# Patient Record
Sex: Male | Born: 2000
Health system: Southern US, Community
[De-identification: ages and names within clinical notes are randomized; demographics above are authoritative.]

---
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
## 2022-02-10 ENCOUNTER — Telehealth: Payer: Self-pay | Admitting: Internal Medicine

## 2022-02-10 NOTE — Telephone Encounter (Signed)
Pt's mother called and requested to make a new patient appointment for Mr. Darren Mayer.   No major issues, just aged out of pediatrician, and needs adhd medication refilled.

## 2022-02-10 NOTE — Telephone Encounter (Signed)
I can accept him-scheduled for 11 AM new patient visit.

## 2022-02-12 NOTE — Telephone Encounter (Signed)
Appointment made

## 2022-03-11 DIAGNOSIS — J309 Allergic rhinitis, unspecified: Secondary | ICD-10-CM | POA: Insufficient documentation

## 2022-03-11 DIAGNOSIS — R278 Other lack of coordination: Secondary | ICD-10-CM | POA: Insufficient documentation

## 2022-03-11 NOTE — Patient Instructions (Addendum)
        Medications changes include :   none    Return in about 6 months (around 09/10/2022) for Physical Exam.

## 2022-03-11 NOTE — Progress Notes (Signed)
Subjective:    Patient ID: Darren Mayer, male    DOB: [birth year], 22 y.o.   MRN: [id number]     HPI Darren Mayer  is here to establish with a new pcp.      He is currently in college-goes to near [employer] in [place].  He is studying finance/accounting.  ADD-since first or second grade he has been on Vyvanse and was initially on 10 mg daily.  In high school that was increased to 20 mg daily.  He takes it only on school days.  It does decrease his appetite a little bit.  He does have a delayed growth spurt, but has increased in height gain over the past year.  He is here with his mother.  They have no concerns.     Medications and allergies reviewed with patient and updated if appropriate.  Current Outpatient Medications on File Prior to Visit  Medication Sig Dispense Refill   Multiple Vitamin (MULTI VITAMIN PO) Take by mouth.     No current facility-administered medications on file prior to visit.    Review of Systems  Constitutional:  Negative for fatigue and fever.  Respiratory:  Negative for cough, shortness of breath and wheezing.   Cardiovascular:  Negative for chest pain, palpitations and leg swelling.  Gastrointestinal:  Negative for abdominal pain, constipation and diarrhea.  Musculoskeletal:  Negative for arthralgias and back pain.  Neurological:  Negative for dizziness, light-headedness and headaches.  Psychiatric/Behavioral:  Negative for dysphoric mood and sleep disturbance. The patient is not nervous/anxious.        Objective:   Vitals:   03/12/22 1110  BP: 110/76  Pulse: 78  Temp: 98.6 F (37 C)  SpO2: 98%   Filed Weights   03/12/22 1110  Weight: 113 lb 3.2 oz (51.3 kg)   Body mass index is 17.73 kg/m.  BP Readings from Last 3 Encounters:  03/12/22 110/76  09/22/20 106/70 (19 %, Z = -0.88 /  65 %, Z = 0.39)*  04/14/20 (!) 118/62 (62 %, Z = 0.31 /  37 %, Z = -0.33)*   *BP percentiles are based on the 2017 AAP  Clinical Practice Guideline for boys    Wt Readings from Last 3 Encounters:  03/12/22 113 lb 3.2 oz (51.3 kg) (1 %, Z= -2.20)*  09/22/20 106 lb 3.2 oz (48.2 kg) (<1 %, Z= -2.35)*  04/14/20 101 lb 6.4 oz (46 kg) (<1 %, Z= -2.55)*   * Growth percentiles are based on CDC (Boys, 2-20 Years) data.      Physical Exam Constitutional:      General: He is not in acute distress.    Appearance: Normal appearance. He is not ill-appearing.  HENT:     Head: Normocephalic and atraumatic.  Eyes:     Conjunctiva/sclera: Conjunctivae normal.  Cardiovascular:     Rate and Rhythm: Normal rate and regular rhythm.     Heart sounds: Normal heart sounds. No murmur heard. Pulmonary:     Effort: Pulmonary effort is normal. No respiratory distress.     Breath sounds: Normal breath sounds. No wheezing or rales.  Musculoskeletal:     Right lower leg: No edema.     Left lower leg: No edema.  Skin:    General: Skin is warm and dry.     Findings: No rash.  Neurological:     Mental Status: He is alert. Mental status is at baseline.  Psychiatric:        Mood  and Affect: Mood normal.            Assessment & Plan:    See Problem List for Assessment and Plan of chronic medical problems.

## 2022-03-12 ENCOUNTER — Encounter: Payer: Self-pay | Admitting: Internal Medicine

## 2022-03-12 ENCOUNTER — Ambulatory Visit: Payer: 59 | Admitting: Internal Medicine

## 2022-03-12 VITALS — BP 110/76 | HR 78 | Temp 98.6°F | Ht 67.0 in | Wt 113.2 lb

## 2022-03-12 DIAGNOSIS — R278 Other lack of coordination: Secondary | ICD-10-CM

## 2022-03-12 DIAGNOSIS — F902 Attention-deficit hyperactivity disorder, combined type: Secondary | ICD-10-CM

## 2022-03-12 DIAGNOSIS — J309 Allergic rhinitis, unspecified: Secondary | ICD-10-CM

## 2022-03-12 MED ORDER — VYVANSE 20 MG PO CAPS: 20.0000 mg | ORAL_CAPSULE | Freq: Every morning | ORAL | 0 refills | Status: DC

## 2022-03-12 NOTE — Assessment & Plan Note (Signed)
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

## 2022-03-12 NOTE — Assessment & Plan Note (Signed)
Chronic Has been on Vyvanse since first grade-has been 10 mg up until high school and since then 20 mg daily This does decrease his appetite slightly and he is trying to gain weight, but otherwise it is well-tolerated He takes this only on school days Continue Vyvanse 20 mg daily

## 2022-03-23 ENCOUNTER — Encounter: Payer: Self-pay | Admitting: Internal Medicine

## 2022-09-10 ENCOUNTER — Encounter (INDEPENDENT_AMBULATORY_CARE_PROVIDER_SITE_OTHER): Payer: Self-pay

## 2022-09-16 ENCOUNTER — Telehealth: Payer: Self-pay | Admitting: Internal Medicine

## 2022-09-16 MED ORDER — VYVANSE 20 MG PO CAPS: 20.0000 mg | ORAL_CAPSULE | Freq: Every morning | ORAL | 0 refills | Status: DC

## 2022-09-16 NOTE — Telephone Encounter (Signed)
Prescription Request  09/16/2022  LOV: 03/12/2022  What is the name of the medication or equipment? VYVANSE 20 MG capsule   Have you contacted your pharmacy to request a refill? No   Which pharmacy would you like this sent to?  [facility] in [address] [place], [place]    Patient notified that their request is being sent to the clinical staff for review and that they should receive a response within 2 business days.   Please advise at Mobile [phone] (mobile)

## 2022-09-16 NOTE — Telephone Encounter (Signed)
Done erx 

## 2022-09-16 NOTE — Telephone Encounter (Signed)
Pt states she need the Vyvanse sent to the [facility] in [place]. Updated pharmacy.Marland KitchenRaechel Chute

## 2022-09-16 NOTE — Telephone Encounter (Signed)
MD is out of the office forwarding to DOD for approval../lmb

## 2022-09-30 ENCOUNTER — Telehealth: Payer: Self-pay | Admitting: Internal Medicine

## 2022-09-30 MED ORDER — LISDEXAMFETAMINE DIMESYLATE 20 MG PO CAPS: 20.0000 mg | ORAL_CAPSULE | Freq: Every day | ORAL | 0 refills | Status: DC

## 2022-09-30 NOTE — Telephone Encounter (Signed)
Patient called and want to know if the generic for vyvanse in to [facility] on [address].  He was unable to pick up the other because the insurance would not pay for it.

## 2022-09-30 NOTE — Telephone Encounter (Signed)
Pt called requesting refill of generic vyvanse. Was last called in on 09/16/22, however pt did not fill due to wrong pharmacy and was not generic. PDMP reviewed - pts last fill was in January 2024. I did call in a 30 day supply, however pt will need to address with PCP as he is taking extremely sporadically and last fill was 7 months ago.

## 2022-10-06 NOTE — Telephone Encounter (Signed)
LM for patient to return call to clinic.

## 2022-12-30 ENCOUNTER — Telehealth: Payer: Self-pay | Admitting: Internal Medicine

## 2022-12-30 NOTE — Telephone Encounter (Signed)
Patient mother dropped off document [employer], to be filled out by provider. Patient mother requested to send it back via Call Patient mother to pick up within 7-days. Document is located in providers tray at front office.Please advise at Mobile [phone] (mobile)

## 2023-01-03 NOTE — Telephone Encounter (Signed)
Form received out of box on 12/31/22.

## 2023-01-06 DIAGNOSIS — Z0279 Encounter for issue of other medical certificate: Secondary | ICD-10-CM

## 2023-01-06 NOTE — Telephone Encounter (Signed)
Form completed and awaiting signature from Dr. Lawerance Bach.

## 2023-01-07 NOTE — Telephone Encounter (Signed)
Left message for patient's mom that paperwork complete and left up front for pick up.

## 2023-01-10 NOTE — Telephone Encounter (Signed)
Patient's mother has picked up paperwork.

## 2023-02-07 ENCOUNTER — Telehealth: Payer: Self-pay | Admitting: Internal Medicine

## 2023-02-07 MED ORDER — LISDEXAMFETAMINE DIMESYLATE 20 MG PO CAPS: 20.0000 mg | ORAL_CAPSULE | Freq: Every day | ORAL | 0 refills | Status: DC

## 2023-02-07 NOTE — Telephone Encounter (Signed)
Prescription Request  02/07/2023  LOV: 03/12/2022  What is the name of the medication or equipment? vyvanse  Have you contacted your pharmacy to request a refill? Yes   Which pharmacy would you like this sent to?  [facility]  - [address], [place], [place]  [address]   Patient notified that their request is being sent to the clinical staff for review and that they should receive a response within 2 business days.   Please advise at Mobile [phone] (mobile)

## 2023-02-07 NOTE — Telephone Encounter (Signed)
[facility]-prescription sent

## 2023-03-08 ENCOUNTER — Encounter: Payer: Self-pay | Admitting: Internal Medicine

## 2023-03-08 NOTE — Progress Notes (Signed)
    Subjective:    Patient ID: Darren Mayer, male    DOB: December 22, 2000, 22 y.o.   MRN: [id number]     HPI Darren Mayer is here for a physical exam and his chronic medical problems.      Medications and allergies reviewed with patient and updated if appropriate.  Current Outpatient Medications on File Prior to Visit  Medication Sig Dispense Refill  . Multiple Vitamin (MULTI VITAMIN PO) Take by mouth.     No current facility-administered medications on file prior to visit.    Review of Systems     Objective:  There were no vitals filed for this visit. There were no vitals filed for this visit. There is no height or weight on file to calculate BMI.  BP Readings from Last 3 Encounters:  04/04/23 112/72  03/12/22 110/76  09/22/20 106/70 (19%, Z = -0.88 /  65%, Z = 0.39)*   *BP percentiles are based on the 2017 AAP Clinical Practice Guideline for boys    Wt Readings from Last 3 Encounters:  04/04/23 113 lb (51.3 kg)  03/12/22 113 lb 3.2 oz (51.3 kg) (1%, Z= -2.20)*  09/22/20 106 lb 3.2 oz (48.2 kg) (<1%, Z= -2.35)*   * Growth percentiles are based on CDC (Boys, 2-20 Years) data.      Physical Exam Constitutional: He appears well-developed and well-nourished. No distress.  HENT:  Head: Normocephalic and atraumatic.  Right Ear: External ear normal.  Left Ear: External ear normal.  Normal ear canals and TM b/l  Mouth/Throat: Oropharynx is clear and moist. Eyes: Conjunctivae and EOM are normal.  Neck: Neck supple. No tracheal deviation present. No thyromegaly present.  No carotid bruit  Cardiovascular: Normal rate, regular rhythm, normal heart sounds and intact distal pulses.   No murmur heard.  No lower extremity edema. Pulmonary/Chest: Effort normal and breath sounds normal. No respiratory distress. He has no wheezes. He has no rales.  Abdominal: Soft. He exhibits no distension. There is no tenderness.  Genitourinary: deferred  Lymphadenopathy:    He has no cervical adenopathy.  Skin: Skin is warm and dry. He is not diaphoretic.  Psychiatric: He has a normal mood and affect. His behavior is normal.         Assessment & Plan:   Physical exam: Screening blood work  ordered Exercise    Weight   Substance abuse   none   Reviewed recommended immunizations.   Health Maintenance  Topic Date Due  . HPV VACCINES (1 - Male 3-dose series) Never done  . HIV Screening  Never done  . Hepatitis C Screening  Never done  . COVID-19 Vaccine (1 - 2024-25 season) 04/20/2023 (Originally 11/07/2022)  . INFLUENZA VACCINE  06/06/2023 (Originally 10/07/2022)  . DTaP/Tdap/Td (1 - Tdap) 04/03/2024 (Originally 01/17/2022)     See Problem List for Assessment and Plan of chronic medical problems.   This encounter was created in error - please disregard.

## 2023-03-08 NOTE — Patient Instructions (Addendum)
 Blood work was ordered.       Medications changes include :   None    A referral was ordered and someone will call you to schedule an appointment.     Return in about 1 year (around 03/13/2024) for Physical Exam.   Health Maintenance, Male Adopting a healthy lifestyle and getting preventive care are important in promoting health and wellness. Ask your health care provider about: The right schedule for you to have regular tests and exams. Things you can do on your own to prevent diseases and keep yourself healthy. What should I know about diet, weight, and exercise? Eat a healthy diet  Eat a diet that includes plenty of vegetables, fruits, low-fat dairy products, and lean protein. Do not eat a lot of foods that are high in solid fats, added sugars, or sodium. Maintain a healthy weight Body mass index (BMI) is a measurement that can be used to identify possible weight problems. It estimates body fat based on height and weight. Your health care provider can help determine your BMI and help you achieve or maintain a healthy weight. Get regular exercise Get regular exercise. This is one of the most important things you can do for your health. Most adults should: Exercise for at least 150 minutes each week. The exercise should increase your heart rate and make you sweat (moderate-intensity exercise). Do strengthening exercises at least twice a week. This is in addition to the moderate-intensity exercise. Spend less time sitting. Even light physical activity can be beneficial. Watch cholesterol and blood lipids Have your blood tested for lipids and cholesterol at 20 years of age, then have this test every 5 years. You may need to have your cholesterol levels checked more often if: Your lipid or cholesterol levels are high. You are older than 20 years of age. You are at high risk for heart disease. What should I know about cancer screening? Many types of cancers can be detected  early and may often be prevented. Depending on your health history and family history, you may need to have cancer screening at various ages. This may include screening for: Colorectal cancer. Prostate cancer. Skin cancer. Lung cancer. What should I know about heart disease, diabetes, and high blood pressure? Blood pressure and heart disease High blood pressure causes heart disease and increases the risk of stroke. This is more likely to develop in people who have high blood pressure readings or are overweight. Talk with your health care provider about your target blood pressure readings. Have your blood pressure checked: Every 3-5 years if you are 85-100 years of age. Every year if you are 6 years old or older. If you are between the ages of 18 and 97 and are a current or former smoker, ask your health care provider if you should have a one-time screening for abdominal aortic aneurysm (AAA). Diabetes Have regular diabetes screenings. This checks your fasting blood sugar level. Have the screening done: Once every three years after age 54 if you are at a normal weight and have a low risk for diabetes. More often and at a younger age if you are overweight or have a high risk for diabetes. What should I know about preventing infection? Hepatitis B If you have a higher risk for hepatitis B, you should be screened for this virus. Talk with your health care provider to find out if you are at risk for hepatitis B infection. Hepatitis C Blood testing is recommended for:  Everyone born from [birth year] through 1965. Anyone with known risk factors for hepatitis C. Sexually transmitted infections (STIs) You should be screened each year for STIs, including gonorrhea and chlamydia, if: You are sexually active and are younger than 20 years of age. You are older than 20 years of age and your health care provider tells you that you are at risk for this type of infection. Your sexual activity has changed since  you were last screened, and you are at increased risk for chlamydia or gonorrhea. Ask your health care provider if you are at risk. Ask your health care provider about whether you are at high risk for HIV. Your health care provider may recommend a prescription medicine to help prevent HIV infection. If you choose to take medicine to prevent HIV, you should first get tested for HIV. You should then be tested every 3 months for as long as you are taking the medicine. Follow these instructions at home: Alcohol use Do not drink alcohol if your health care provider tells you not to drink. If you drink alcohol: Limit how much you have to 0-2 drinks a day. Know how much alcohol is in your drink. In the [place] one drink equals one 12 oz bottle of beer (355 mL), one 5 oz glass of wine (148 mL), or one 1 oz glass of hard liquor (44 mL). Lifestyle Do not use any products that contain nicotine or tobacco. These products include cigarettes, chewing tobacco, and vaping devices, such as e-cigarettes. If you need help quitting, ask your health care provider. Do not use street drugs. Do not share needles. Ask your health care provider for help if you need support or information about quitting drugs. General instructions Schedule regular health, dental, and eye exams. Stay current with your vaccines. Tell your health care provider if: You often feel depressed. You have ever been abused or do not feel safe at home. Summary Adopting a healthy lifestyle and getting preventive care are important in promoting health and wellness. Follow your health care provider's instructions about healthy diet, exercising, and getting tested or screened for diseases. Follow your health care provider's instructions on monitoring your cholesterol and blood pressure. This information is not intended to replace advice given to you by your health care provider. Make sure you discuss any questions you have with your health care  provider. Document Revised: 07/14/2020 Document Reviewed: 07/14/2020 Elsevier Patient Education  2024 [employer].

## 2023-03-14 ENCOUNTER — Encounter: Payer: 59 | Admitting: Internal Medicine

## 2023-03-14 DIAGNOSIS — Z0000 Encounter for general adult medical examination without abnormal findings: Secondary | ICD-10-CM

## 2023-04-03 ENCOUNTER — Encounter: Payer: Self-pay | Admitting: Internal Medicine

## 2023-04-03 NOTE — Progress Notes (Unsigned)
Subjective:    Patient ID: Darren Mayer, male    DOB: 10-Nov-2000, 22 y.o.   MRN: [id number]     HPI Darren Mayer is here for a physical exam and his chronic medical problems.   Darren Mayer well - no concerns.  He just returned from [place] where he went for a study abroad program for college  He has no concerns   Medications and allergies reviewed with patient and updated if appropriate.  Current Outpatient Medications on File Prior to Visit  Medication Sig Dispense Refill   Multiple Vitamin (MULTI VITAMIN PO) Take by mouth.     No current facility-administered medications on file prior to visit.    Review of Systems  Constitutional:  Negative for fever.  Eyes:  Negative for visual disturbance.  Respiratory:  Negative for cough, shortness of breath and wheezing.   Cardiovascular:  Negative for chest pain, palpitations and leg swelling.  Gastrointestinal:  Negative for abdominal pain, blood in stool, constipation and diarrhea.       No gerd  Genitourinary:  Negative for difficulty urinating and dysuria.  Musculoskeletal:  Negative for arthralgias and back pain.  Skin:  Negative for rash.  Neurological:  Negative for light-headedness and headaches.  Psychiatric/Behavioral:  Negative for dysphoric mood. The patient is not nervous/anxious.        Objective:   Vitals:   04/04/23 1453  BP: 112/72  Pulse: 86  Temp: 98.6 F (37 C)  SpO2: 98%   Filed Weights   04/04/23 1453  Weight: 113 lb (51.3 kg)   Body mass index is 17.7 kg/m.  BP Readings from Last 3 Encounters:  04/04/23 112/72  03/12/22 110/76  09/22/20 106/70 (19%, Z = -0.88 /  65%, Z = 0.39)*   *BP percentiles are based on the 2017 AAP Clinical Practice Guideline for boys    Wt Readings from Last 3 Encounters:  04/04/23 113 lb (51.3 kg)  03/12/22 113 lb 3.2 oz (51.3 kg) (1%, Z= -2.20)*  09/22/20 106 lb 3.2 oz (48.2 kg) (<1%, Z= -2.35)*   * Growth percentiles are based on CDC (Boys,  2-20 Years) data.      Physical Exam Constitutional: He appears well-developed and well-nourished. No distress.  HENT:  Head: Normocephalic and atraumatic.  Right Ear: External ear normal.  Left Ear: External ear normal.  Normal ear canals and TM b/l  Mouth/Throat: Oropharynx is clear and moist. Eyes: Conjunctivae and EOM are normal.  Neck: Neck supple. No tracheal deviation present. No thyromegaly present.  No carotid bruit  Cardiovascular: Normal rate, regular rhythm, normal heart sounds and intact distal pulses.   No murmur heard.  No lower extremity edema. Pulmonary/Chest: Effort normal and breath sounds normal. No respiratory distress. He has no wheezes. He has no rales.  Abdominal: Soft. He exhibits no distension. There is no tenderness.  Genitourinary: deferred  Lymphadenopathy:   He has no cervical adenopathy.  Skin: Skin is warm and dry. He is not diaphoretic.  Psychiatric: He has a normal mood and affect. His behavior is normal.         Assessment & Plan:   Physical exam: Screening blood work deferred Exercise   club soccer, gym occ Weight  normal  - on light side - stable Substance abuse   none He does wear his seatbelt daily He started does drink some alcohol advised to drink responsibly Advised protection when and if sexually active  Reviewed recommended immunizations.  We do have his pediatricians records  for his immunizations and it looks like he is up-to-date, but will need to abstract and can reevaluate at his next visit   Health Maintenance  Topic Date Due   HPV VACCINES (1 - Male 3-dose series) Never done   HIV Screening  Never done   Hepatitis C Screening  Never done   COVID-19 Vaccine (1 - 2024-25 season) 04/20/2023 (Originally 11/07/2022)   INFLUENZA VACCINE  06/06/2023 (Originally 10/07/2022)   DTaP/Tdap/Td (1 - Tdap) 04/03/2024 (Originally 01/17/2022)     See Problem List for Assessment and Plan of chronic medical problems.

## 2023-04-03 NOTE — Patient Instructions (Addendum)
Blood work was ordered.       Medications changes include :   None    A referral was ordered and someone will call you to schedule an appointment.     Return in about 6 months (around 10/02/2023) for follow up.   Health Maintenance, Male Adopting a healthy lifestyle and getting preventive care are important in promoting health and wellness. Ask your health care provider about: The right schedule for you to have regular tests and exams. Things you can do on your own to prevent diseases and keep yourself healthy. What should I know about diet, weight, and exercise? Eat a healthy diet  Eat a diet that includes plenty of vegetables, fruits, low-fat dairy products, and lean protein. Do not eat a lot of foods that are high in solid fats, added sugars, or sodium. Maintain a healthy weight Body mass index (BMI) is a measurement that can be used to identify possible weight problems. It estimates body fat based on height and weight. Your health care provider can help determine your BMI and help you achieve or maintain a healthy weight. Get regular exercise Get regular exercise. This is one of the most important things you can do for your health. Most adults should: Exercise for at least 150 minutes each week. The exercise should increase your heart rate and make you sweat (moderate-intensity exercise). Do strengthening exercises at least twice a week. This is in addition to the moderate-intensity exercise. Spend less time sitting. Even light physical activity can be beneficial. Watch cholesterol and blood lipids Have your blood tested for lipids and cholesterol at 21 years of age, then have this test every 5 years. You may need to have your cholesterol levels checked more often if: Your lipid or cholesterol levels are high. You are older than 21 years of age. You are at high risk for heart disease. What should I know about cancer screening? Many types of cancers can be detected  early and may often be prevented. Depending on your health history and family history, you may need to have cancer screening at various ages. This may include screening for: Colorectal cancer. Prostate cancer. Skin cancer. Lung cancer. What should I know about heart disease, diabetes, and high blood pressure? Blood pressure and heart disease High blood pressure causes heart disease and increases the risk of stroke. This is more likely to develop in people who have high blood pressure readings or are overweight. Talk with your health care provider about your target blood pressure readings. Have your blood pressure checked: Every 3-5 years if you are 53-42 years of age. Every year if you are 17 years old or older. If you are between the ages of 64 and 61 and are a current or former smoker, ask your health care provider if you should have a one-time screening for abdominal aortic aneurysm (AAA). Diabetes Have regular diabetes screenings. This checks your fasting blood sugar level. Have the screening done: Once every three years after age 35 if you are at a normal weight and have a low risk for diabetes. More often and at a younger age if you are overweight or have a high risk for diabetes. What should I know about preventing infection? Hepatitis B If you have a higher risk for hepatitis B, you should be screened for this virus. Talk with your health care provider to find out if you are at risk for hepatitis B infection. Hepatitis C Blood testing is recommended for:  Everyone born from [birth year] through 1965. Anyone with known risk factors for hepatitis C. Sexually transmitted infections (STIs) You should be screened each year for STIs, including gonorrhea and chlamydia, if: You are sexually active and are younger than 21 years of age. You are older than 21 years of age and your health care provider tells you that you are at risk for this type of infection. Your sexual activity has changed since  you were last screened, and you are at increased risk for chlamydia or gonorrhea. Ask your health care provider if you are at risk. Ask your health care provider about whether you are at high risk for HIV. Your health care provider may recommend a prescription medicine to help prevent HIV infection. If you choose to take medicine to prevent HIV, you should first get tested for HIV. You should then be tested every 3 months for as long as you are taking the medicine. Follow these instructions at home: Alcohol use Do not drink alcohol if your health care provider tells you not to drink. If you drink alcohol: Limit how much you have to 0-2 drinks a day. Know how much alcohol is in your drink. In the [place] one drink equals one 12 oz bottle of beer (355 mL), one 5 oz glass of wine (148 mL), or one 1 oz glass of hard liquor (44 mL). Lifestyle Do not use any products that contain nicotine or tobacco. These products include cigarettes, chewing tobacco, and vaping devices, such as e-cigarettes. If you need help quitting, ask your health care provider. Do not use street drugs. Do not share needles. Ask your health care provider for help if you need support or information about quitting drugs. General instructions Schedule regular health, dental, and eye exams. Stay current with your vaccines. Tell your health care provider if: You often feel depressed. You have ever been abused or do not feel safe at home. Summary Adopting a healthy lifestyle and getting preventive care are important in promoting health and wellness. Follow your health care provider's instructions about healthy diet, exercising, and getting tested or screened for diseases. Follow your health care provider's instructions on monitoring your cholesterol and blood pressure. This information is not intended to replace advice given to you by your health care provider. Make sure you discuss any questions you have with your health care  provider. Document Revised: 07/14/2020 Document Reviewed: 07/14/2020 Elsevier Patient Education  2024 [employer].

## 2023-04-04 ENCOUNTER — Ambulatory Visit (INDEPENDENT_AMBULATORY_CARE_PROVIDER_SITE_OTHER): Payer: 59 | Admitting: Internal Medicine

## 2023-04-04 VITALS — BP 112/72 | HR 86 | Temp 98.6°F | Ht 67.0 in | Wt 113.0 lb

## 2023-04-04 DIAGNOSIS — Z0000 Encounter for general adult medical examination without abnormal findings: Secondary | ICD-10-CM

## 2023-04-04 DIAGNOSIS — F902 Attention-deficit hyperactivity disorder, combined type: Secondary | ICD-10-CM

## 2023-04-04 MED ORDER — LISDEXAMFETAMINE DIMESYLATE 20 MG PO CAPS: 20.0000 mg | ORAL_CAPSULE | Freq: Every day | ORAL | 0 refills | Status: DC

## 2023-04-04 NOTE — Assessment & Plan Note (Signed)
Chronic Controlled Tolerates medication well without side effects Only takes on school days Continue Vyvanse 20 mg daily

## 2023-04-15 ENCOUNTER — Other Ambulatory Visit: Payer: Self-pay | Admitting: Internal Medicine

## 2023-04-15 NOTE — Telephone Encounter (Signed)
   Copied from CRM [id number]. Topic: Clinical - Medication Refill >> Apr 15, 2023  3:09 PM Tonda B wrote: Most Recent Primary Care Visit:  Provider: GEOFM GLADE PARAS  Department: [facility]  Visit Type: PHYSICAL  Date: 04/04/2023  Medication: lisdexamfetamine (VYVANSE ) 20 MG  Has the patient contacted their pharmacy? Yes (Agent: If no, request that the patient contact the pharmacy for the refill. If patient does not wish to contact the pharmacy document the reason why and proceed with request.) (Agent: If yes, when and what did the pharmacy advise?)  Is this the correct pharmacy for this prescription? Yes If no, delete pharmacy and type the correct one.  This is the patient's preferred pharmacy:    [facility] #[id number], [place] - [address] AT [facility] [address] [place] [address] Phone: [phone] Fax: [phone]   Has the prescription been filled recently? Yes  Is the patient out of the medication? Yes  Has the patient been seen for an appointment in the last year OR does the patient have an upcoming appointment? Yes  Can we respond through MyChart? No  Agent: Please be advised that Rx refills may take up to 3 business days. We ask that you follow-up with your pharmacy.

## 2023-04-16 MED ORDER — LISDEXAMFETAMINE DIMESYLATE 20 MG PO CAPS: 20.0000 mg | ORAL_CAPSULE | Freq: Every day | ORAL | 0 refills | Status: DC

## 2023-04-22 ENCOUNTER — Telehealth: Payer: Self-pay

## 2023-04-22 NOTE — Telephone Encounter (Signed)
Copied from CRM [id number]. Topic: Clinical - Prescription Issue >> Apr 22, 2023  2:48 PM Isabell A wrote: Reason for CRM: Mom calling in regard to lisdexamfetamine (VYVANSE) 20 MG, refills were sent to the wrong pharmacy - she is requesting the generic brand. Insurance wont cover the non-generic brand.    [facility] #[id number] - [place], [place] - [address] AT [facility] [address] [place] [address]  Phone: [phone] Fax: [phone]

## 2023-04-25 MED ORDER — LISDEXAMFETAMINE DIMESYLATE 20 MG PO CAPS
20.0000 mg | ORAL_CAPSULE | Freq: Every day | ORAL | 0 refills | Status: DC
Start: 2023-04-25 — End: 2023-06-30

## 2023-04-25 MED ORDER — LISDEXAMFETAMINE DIMESYLATE 20 MG PO CAPS: 20.0000 mg | ORAL_CAPSULE | Freq: Every day | ORAL | 0 refills | Status: DC

## 2023-04-25 NOTE — Addendum Note (Signed)
Addended by: Pincus Sanes on: 04/25/2023 12:09 PM   Modules accepted: Orders

## 2023-06-28 ENCOUNTER — Telehealth: Payer: Self-pay

## 2023-06-28 NOTE — Telephone Encounter (Signed)
 Copied from CRM [id number]. Topic: General - Other >> Jun 27, 2023  4:30 PM Howard Macho wrote: Reason for CRM: patient mom called stating he is losing weight on vyvanse  and he would like an alternative that does not cause appetite supressant [phone]

## 2023-06-28 NOTE — Telephone Encounter (Signed)
 Options would include -    decreasing the Vyvanse  dose back to 10 mg if that causes less of a decrease in appetite  Trying a nonstimulant medication such as intuiv, as long as this is covered by his insurance-this is typically only used in children and adolescents.  This medication is less likely to cause decreased appetite, but it still can.  We could also try Strattera which is a nonstimulant.  This is also less likely to cause weight gain.  There is a slight increased risk of depression and thoughts of suicide in the first month, but this is not common.   We can try Adderall or Ritalin, but they are also very likely to decrease appetite.

## 2023-06-30 MED ORDER — GUANFACINE HCL ER 1 MG PO TB24
ORAL_TABLET | ORAL | 0 refills | Status: DC
Start: 2023-06-30 — End: 2023-06-30

## 2023-06-30 MED ORDER — GUANFACINE HCL ER 1 MG PO TB24: ORAL_TABLET | ORAL | 0 refills | Status: AC

## 2023-06-30 NOTE — Telephone Encounter (Signed)
 Rx sent to [facility] [place]

## 2023-06-30 NOTE — Addendum Note (Signed)
 Addended by: Colene Dauphin on: 06/30/2023 08:39 PM   Modules accepted: Orders

## 2023-06-30 NOTE — Addendum Note (Signed)
 Addended by: Colene Dauphin on: 06/30/2023 03:14 PM   Modules accepted: Orders

## 2023-06-30 NOTE — Telephone Encounter (Signed)
 Prescription sent to [facility].  I like him to try 1 pill in the morning for 1 week and then increase to 2 mg daily in the morning to see how that works.  Depending on how that works we will increase to 3 mg daily and adjust slowly.

## 2023-10-03 ENCOUNTER — Ambulatory Visit: Payer: 59 | Admitting: Internal Medicine

## 2023-10-16 NOTE — Patient Instructions (Addendum)
       Medications changes include :   None     Return in about 6 months (around 04/18/2024) for Physical Exam.

## 2023-10-16 NOTE — Progress Notes (Unsigned)
 Subjective:    Patient ID: Darren Mayer, male    DOB: 24-Dec-2000, 22 y.o.   MRN: [id number]     HPI Darren Mayer is here for follow up of his chronic medical problems.   Took summer classes and was taking Intuniv  2 mg.  He did not feel this worked as well but the Vyvanse .  His brother is on Adderall and he wondered about that as well.  He goes back to school in couple of weeks.  Was sick one day - went to health center -- told he had  a lot of wax in his ears.  Tried to clean them out there but was told he still has some wax.  He denies ear pain, decreased hearing or ear discharge.    Medications and allergies reviewed with patient and updated if appropriate.  Current Outpatient Medications on File Prior to Visit  Medication Sig Dispense Refill   guanFACINE  (INTUNIV ) 1 MG TB24 ER tablet Take 1 pill daily in the morning x 1 week, then increase to 2 pills daily in the morning 49 tablet 0   lisdexamfetamine (VYVANSE ) 20 MG capsule Take 20 mg by mouth daily.     Multiple Vitamin (MULTI VITAMIN PO) Take by mouth.     No current facility-administered medications on file prior to visit.     Review of Systems  Constitutional:  Negative for appetite change.  HENT:  Negative for ear discharge, ear pain and hearing loss.   Cardiovascular:  Negative for chest pain and palpitations.  Neurological:  Negative for headaches.       Objective:   Vitals:   10/17/23 1108  BP: 106/74  Pulse: 70  Temp: 98.6 F (37 C)  SpO2: 98%   BP Readings from Last 3 Encounters:  10/17/23 106/74  04/04/23 112/72  03/12/22 110/76   Wt Readings from Last 3 Encounters:  10/17/23 117 lb (53.1 kg)  04/04/23 113 lb (51.3 kg)  03/12/22 113 lb 3.2 oz (51.3 kg) (1%, Z= -2.20)*   * Growth percentiles are based on CDC (Boys, 2-20 Years) data.   Body mass index is 18.32 kg/m.    Physical Exam Constitutional:      General: He is not in acute distress.    Appearance: Normal  appearance. He is not ill-appearing.  HENT:     Head: Normocephalic and atraumatic.     Right Ear: Tympanic membrane and external ear normal. There is no impacted cerumen.     Left Ear: Tympanic membrane and external ear normal. There is no impacted cerumen.     Ears:     Comments: Mild amount of wax bilaterally-nonobstructive Skin:    General: Skin is warm and dry.  Neurological:     General: No focal deficit present.     Mental Status: He is alert. Mental status is at baseline.  Psychiatric:        Mood and Affect: Mood normal.        Behavior: Behavior normal.        Lab Results  Component Value Date   WBC 4.9 07/08/2017   HGB 12.5 07/08/2017   HCT 36.9 07/08/2017   PLT 278 07/08/2017   GLUCOSE 120 (H) 07/08/2017   ALT 16 07/08/2017   AST 26 07/08/2017   NA 140 07/08/2017   K 3.9 07/08/2017   CL 103 07/08/2017   CREATININE 0.67 07/08/2017   BUN 11 07/08/2017   CO2 27 07/08/2017   TSH 2.29  08/09/2016     Assessment & Plan:    See Problem List for Assessment and Plan of chronic medical problems.

## 2023-10-17 ENCOUNTER — Ambulatory Visit: Admitting: Internal Medicine

## 2023-10-17 ENCOUNTER — Encounter: Payer: Self-pay | Admitting: Internal Medicine

## 2023-10-17 VITALS — BP 106/74 | HR 70 | Temp 98.6°F | Ht 67.0 in | Wt 117.0 lb

## 2023-10-17 DIAGNOSIS — F902 Attention-deficit hyperactivity disorder, combined type: Secondary | ICD-10-CM

## 2023-10-17 MED ORDER — AMPHETAMINE-DEXTROAMPHET ER 15 MG PO CP24: 15.0000 mg | ORAL_CAPSULE | ORAL | 0 refills | Status: DC

## 2023-10-17 NOTE — Assessment & Plan Note (Addendum)
 Chronic He did not find Intuniv  2 mg daily effective Vyvanse  worked better for him and he either wants to try that again or consider Adderall which she has not been on but his brother is on it and feels that helps Trial of Adderall xr 15 mg daily prn Only takes on school days If Adderall is not effective will go back to Vyvanse  20 mg daily

## 2023-11-09 ENCOUNTER — Telehealth: Payer: Self-pay | Admitting: Internal Medicine

## 2023-11-09 NOTE — Telephone Encounter (Signed)
 Patient dropped off document Interim Medical form for a travel/study program, to be filled out by provider. Patient requested to send it back via Call Patient to pick up within 7-days. Document is located in providers tray at front office.Please advise at Mobile [phone] (mobile)

## 2023-11-10 NOTE — Telephone Encounter (Signed)
Form completed and awaiting Dr. Lawerance Bach signature ?

## 2023-11-10 NOTE — Telephone Encounter (Signed)
Form received today.

## 2023-11-11 NOTE — Telephone Encounter (Signed)
 Called patient today and left message.  Form completed and ready for pick up.  Form has been left up front

## 2023-11-22 ENCOUNTER — Encounter: Payer: Self-pay | Admitting: Internal Medicine

## 2023-11-22 MED ORDER — AMPHETAMINE-DEXTROAMPHET ER 20 MG PO CP24
20.0000 mg | ORAL_CAPSULE | ORAL | Status: DC
Start: 2023-11-22 — End: 2023-11-23

## 2023-11-23 MED ORDER — AMPHETAMINE-DEXTROAMPHET ER 20 MG PO CP24: 20.0000 mg | ORAL_CAPSULE | ORAL | 0 refills | Status: DC

## 2023-11-23 NOTE — Addendum Note (Signed)
 Addended by: GEOFM GLADE PARAS on: 11/23/2023 12:47 PM   Modules accepted: Orders

## 2023-12-22 ENCOUNTER — Ambulatory Visit (INDEPENDENT_AMBULATORY_CARE_PROVIDER_SITE_OTHER)

## 2023-12-22 DIAGNOSIS — Z23 Encounter for immunization: Secondary | ICD-10-CM | POA: Diagnosis not present

## 2023-12-22 NOTE — Progress Notes (Cosign Needed Addendum)
 Pt received there flu vaccine today without complications   Please sign as Md is out of office  Medical screening examination/treatment/procedure(s) were performed by non-physician practitioner and as supervising physician I was immediately available for consultation/collaboration.  I agree with above. Karlynn Noel, MD

## 2024-01-18 ENCOUNTER — Encounter: Payer: Self-pay | Admitting: Internal Medicine

## 2024-01-18 MED ORDER — AMPHETAMINE-DEXTROAMPHET ER 20 MG PO CP24: 20.0000 mg | ORAL_CAPSULE | ORAL | 0 refills | Status: DC

## 2024-04-10 ENCOUNTER — Encounter: Payer: Self-pay | Admitting: Internal Medicine

## 2024-04-10 MED ORDER — AMPHETAMINE-DEXTROAMPHET ER 20 MG PO CP24: 20.0000 mg | ORAL_CAPSULE | ORAL | 0 refills | Status: AC

## 2024-05-01 ENCOUNTER — Encounter: Admitting: Internal Medicine
